# Patient Record
Sex: Female | Born: 1955 | Race: White | Hispanic: No | Marital: Married
Health system: Southern US, Community
[De-identification: ages and names within clinical notes are randomized; demographics above are authoritative.]

---

## 2017-01-05 ENCOUNTER — Ambulatory Visit: Payer: Self-pay | Admitting: Sports Medicine

## 2017-01-05 ENCOUNTER — Ambulatory Visit (INDEPENDENT_AMBULATORY_CARE_PROVIDER_SITE_OTHER): Payer: BLUE CROSS/BLUE SHIELD | Admitting: Sports Medicine

## 2017-01-05 ENCOUNTER — Ambulatory Visit (INDEPENDENT_AMBULATORY_CARE_PROVIDER_SITE_OTHER): Payer: BLUE CROSS/BLUE SHIELD

## 2017-01-05 ENCOUNTER — Encounter: Payer: Self-pay | Admitting: Sports Medicine

## 2017-01-05 DIAGNOSIS — Q667 Congenital pes cavus, unspecified foot: Secondary | ICD-10-CM

## 2017-01-05 DIAGNOSIS — M722 Plantar fascial fibromatosis: Secondary | ICD-10-CM | POA: Diagnosis not present

## 2017-01-05 DIAGNOSIS — R52 Pain, unspecified: Secondary | ICD-10-CM

## 2017-01-05 DIAGNOSIS — B351 Tinea unguium: Secondary | ICD-10-CM

## 2017-01-05 NOTE — Progress Notes (Signed)
   Subjective:    Patient ID: Tracey Smith, female    DOB: 1956-04-20, 61 y.o.   MRN: [id number]  HPI   I have some fungus on my right big toe and it has some cracking to it and took polish off and there was a weird color and the right heel is hurting and it is better but I do have inserts and wore heels for years at work    Review of Systems  All other systems reviewed and are negative.      Objective:   Physical Exam        Assessment & Plan:

## 2017-01-05 NOTE — Progress Notes (Signed)
Subjective: Tracey Smith is a 61 y.o. female patient seen today in office with complaint of painful thickened and discolored right first toenail Patient is desiring treatment for nail changes; has tried OTC topicals/Medication/Penlac in the past with no improvement. Reports that nail is becoming difficult to manage because of the thickness. Patient also admits to occasional right heel and arch pain. States that it is better after using orthotics, which she had made few years ago when she lived in [place]. States that sometimes she gets pain after work with leg spasms. However states since wearing orthotics. Pain has been much better spasms have been less frequent. Patient has no other pedal complaints at this time.   There are no active problems to display for this patient.   No current outpatient prescriptions on file prior to visit.   No current facility-administered medications on file prior to visit.     Allergies  Allergen Reactions  . Hydrocodone   . Eggs Or Egg-Derived Products Nausea Only    Objective: Physical Exam  General: Well developed, nourished, no acute distress, awake, alert and oriented x 3  Vascular: Dorsalis pedis artery 1/4 bilateral, Posterior tibial artery 1/4 bilateral, skin temperature warm to warm proximal to distal bilateral lower extremities, no varicosities, pedal hair present bilateral.  Neurological: Gross sensation present via light touch bilateral.   Dermatological: Skin is warm, dry, and supple bilateral, Right first toenail is short thick, and discolored with mild subungal debris, all other nails within normal limits, no webspace macerations present bilateral, no open lesions present bilateral, no callus/corns/hyperkeratotic tissue present bilateral. No signs of infection bilateral.  Musculoskeletal: No boney deformities noted bilateral. Subjective occasional pain along the right heel extending out arch likely from plantar fasciitis with equinus noted  bilateral. Muscular strength within normal limits without painon range of motion. No pain with calf compression bilateral.  X-rays right foot reveal pes cavus deformity. No other acute findings.  Assessment and Plan:  Problem List Items Addressed This Visit    None    Visit Diagnoses    Dermatophytosis of nail    -  Primary   R 1st toe   Relevant Medications   valACYclovir (VALTREX) 500 MG tablet   Other Relevant Orders   Fungus Culture with Smear   Pain       Relevant Orders   DG Foot Complete Right   Pes cavus       Plantar fasciitis          -Examined patient -Advised patient daily stretching and icing to avoid flare up for reoccurrence of right heel and arch pain. Also advised patient that she would benefit from a new custom set of orthotics. Patient will like office to check insurance coverage and will decide at next visit if she wants to be casted for scan for orthotics -Discussed treatment options for painful dystrophic nails  - Fungal culture of right first toenail was obtained and sent to [facility] lab -Patient to return in 4 weeks for follow up evaluation and discussion of fungal culture results or sooner if symptoms worsen.  Landis Martins, DPM

## 2017-01-05 NOTE — Patient Instructions (Signed)
For tennis shoes recommend:  Brooks Beast Ascis New balance Saucony Can be purchased at [employer] or [employer]  Vionic  SAS Can be purchased at [employer] or [employer]   For work shoes recommend: Sketchers Work Timberland boots  Can be purchased at a variety of places or [employer]   For casual shoes recommend: Vionic  Can be purchased at [employer] or [employer]  

## 2017-02-02 ENCOUNTER — Ambulatory Visit (INDEPENDENT_AMBULATORY_CARE_PROVIDER_SITE_OTHER): Payer: BLUE CROSS/BLUE SHIELD | Admitting: Sports Medicine

## 2017-02-02 DIAGNOSIS — R52 Pain, unspecified: Secondary | ICD-10-CM

## 2017-02-02 DIAGNOSIS — Q667 Congenital pes cavus, unspecified foot: Secondary | ICD-10-CM

## 2017-02-02 DIAGNOSIS — B351 Tinea unguium: Secondary | ICD-10-CM | POA: Diagnosis not present

## 2017-02-02 DIAGNOSIS — M722 Plantar fascial fibromatosis: Secondary | ICD-10-CM

## 2017-02-02 NOTE — Progress Notes (Signed)
Subjective: Tracey Smith is a 61 y.o. female patient seen today in office for fungal culture results. Patient has no other pedal complaints at this time.   There are no active problems to display for this patient.   Current Outpatient Prescriptions on File Prior to Visit  Medication Sig Dispense Refill  . CALCIUM PO Take by mouth.    . cetirizine (ZYRTEC) 10 MG tablet Take by mouth.    . estradiol (ESTRACE) 0.1 MG/GM vaginal cream Place 1 gram vaginally twice per week.    . valACYclovir (VALTREX) 500 MG tablet Take by mouth.    Marland Kitchen VITAMIN D, CHOLECALCIFEROL, PO Take by mouth.     No current facility-administered medications on file prior to visit.     Allergies  Allergen Reactions  . Hydrocodone   . Eggs Or Egg-Derived Products Nausea Only    Objective: Physical Exam  General: Well developed, nourished, no acute distress, awake, alert and oriented x 3  Vascular: Dorsalis pedis artery 2/4 bilateral, Posterior tibial artery 2/4 bilateral, skin temperature warm to warm proximal to distal bilateral lower extremities, no varicosities, pedal hair present bilateral.  Neurological: Gross sensation present via light touch bilateral.   Dermatological: Skin is warm, dry, and supple bilateral, Nails 1-10 are tender, short thick, and discolored with mild subungal debris with the right 1st toenail most involved, no webspace macerations present bilateral, no open lesions present bilateral, no callus/corns/hyperkeratotic tissue present bilateral. No signs of infection bilateral.  Musculoskeletal: No boney deformities noted bilateral. No reproducible plantar fascia pain. Muscular strength within normal limits without painon range of motion. No pain with calf compression bilateral.  Fungal culture + T Rubrum  Assessment and Plan:  Problem List Items Addressed This Visit    None    Visit Diagnoses    Dermatophytosis of nail    -  Primary   Relevant Orders   Hepatic Function Panel   Pain        Pes cavus       Plantar fasciitis          -Examined patient -Discussed treatment options for painful mycotic nails -Patient opt for oral Lamisil with full understanding of medication risks; ordered LFTs for review if within normal limits will proceed with sending Rx to pharmacy for lamisil 210m PO daily. Anticipate 12 week course.  -Patient opt for topical as well, Rx request for [employer] topical antifungal sent  -Advised good hygiene habits -Patient will like to wait for custom orthotics once her out-of-pocket and deductible is met. -Patient to return in 6 weeks for follow up evaluation or sooner if symptoms worsen.  TLandis Martins DPM

## 2017-02-03 ENCOUNTER — Telehealth: Payer: Self-pay | Admitting: *Deleted

## 2017-02-03 MED ORDER — NONFORMULARY OR COMPOUNDED ITEM: 2 refills | Status: AC

## 2017-02-03 NOTE — Telephone Encounter (Addendum)
-----   Message from Landis Martins, [place] sent at 02/02/2017  5:47 PM EDT ----- Regarding: [employer] Topical antifungal.02/03/2017-Faxed orders to [facility].

## 2017-03-17 ENCOUNTER — Ambulatory Visit: Payer: BLUE CROSS/BLUE SHIELD | Admitting: Sports Medicine

## 2018-08-16 DIAGNOSIS — Z01419 Encounter for gynecological examination (general) (routine) without abnormal findings: Secondary | ICD-10-CM | POA: Diagnosis not present

## 2018-08-16 DIAGNOSIS — Z6824 Body mass index (BMI) 24.0-24.9, adult: Secondary | ICD-10-CM | POA: Diagnosis not present

## 2018-08-16 DIAGNOSIS — Z1231 Encounter for screening mammogram for malignant neoplasm of breast: Secondary | ICD-10-CM | POA: Diagnosis not present

## 2019-07-31 DIAGNOSIS — Z23 Encounter for immunization: Secondary | ICD-10-CM | POA: Diagnosis not present

## 2019-07-31 DIAGNOSIS — Z13220 Encounter for screening for lipoid disorders: Secondary | ICD-10-CM | POA: Diagnosis not present

## 2019-07-31 DIAGNOSIS — E559 Vitamin D deficiency, unspecified: Secondary | ICD-10-CM | POA: Diagnosis not present

## 2019-07-31 DIAGNOSIS — Z0000 Encounter for general adult medical examination without abnormal findings: Secondary | ICD-10-CM | POA: Diagnosis not present

## 2019-07-31 DIAGNOSIS — Z780 Asymptomatic menopausal state: Secondary | ICD-10-CM | POA: Diagnosis not present

## 2019-09-12 DIAGNOSIS — Z1231 Encounter for screening mammogram for malignant neoplasm of breast: Secondary | ICD-10-CM | POA: Diagnosis not present

## 2019-09-12 DIAGNOSIS — Z01419 Encounter for gynecological examination (general) (routine) without abnormal findings: Secondary | ICD-10-CM | POA: Diagnosis not present

## 2019-09-12 DIAGNOSIS — Z124 Encounter for screening for malignant neoplasm of cervix: Secondary | ICD-10-CM | POA: Diagnosis not present

## 2019-11-07 DIAGNOSIS — D225 Melanocytic nevi of trunk: Secondary | ICD-10-CM | POA: Diagnosis not present

## 2019-11-07 DIAGNOSIS — L918 Other hypertrophic disorders of the skin: Secondary | ICD-10-CM | POA: Diagnosis not present

## 2019-11-07 DIAGNOSIS — D1801 Hemangioma of skin and subcutaneous tissue: Secondary | ICD-10-CM | POA: Diagnosis not present

## 2019-11-07 DIAGNOSIS — L570 Actinic keratosis: Secondary | ICD-10-CM | POA: Diagnosis not present

## 2019-11-07 DIAGNOSIS — L820 Inflamed seborrheic keratosis: Secondary | ICD-10-CM | POA: Diagnosis not present

## 2019-11-13 DIAGNOSIS — M25551 Pain in right hip: Secondary | ICD-10-CM | POA: Diagnosis not present

## 2019-11-13 DIAGNOSIS — M545 Low back pain: Secondary | ICD-10-CM | POA: Diagnosis not present

## 2019-11-13 DIAGNOSIS — R269 Unspecified abnormalities of gait and mobility: Secondary | ICD-10-CM | POA: Diagnosis not present

## 2019-11-13 DIAGNOSIS — M25571 Pain in right ankle and joints of right foot: Secondary | ICD-10-CM | POA: Diagnosis not present

## 2019-11-26 DIAGNOSIS — M25571 Pain in right ankle and joints of right foot: Secondary | ICD-10-CM | POA: Diagnosis not present

## 2019-11-26 DIAGNOSIS — M545 Low back pain: Secondary | ICD-10-CM | POA: Diagnosis not present

## 2019-11-26 DIAGNOSIS — M25551 Pain in right hip: Secondary | ICD-10-CM | POA: Diagnosis not present

## 2019-11-26 DIAGNOSIS — R269 Unspecified abnormalities of gait and mobility: Secondary | ICD-10-CM | POA: Diagnosis not present

## 2019-12-11 DIAGNOSIS — M545 Low back pain: Secondary | ICD-10-CM | POA: Diagnosis not present

## 2019-12-11 DIAGNOSIS — R269 Unspecified abnormalities of gait and mobility: Secondary | ICD-10-CM | POA: Diagnosis not present

## 2019-12-11 DIAGNOSIS — M25571 Pain in right ankle and joints of right foot: Secondary | ICD-10-CM | POA: Diagnosis not present

## 2019-12-11 DIAGNOSIS — M25551 Pain in right hip: Secondary | ICD-10-CM | POA: Diagnosis not present

## 2019-12-26 DIAGNOSIS — M545 Low back pain: Secondary | ICD-10-CM | POA: Diagnosis not present

## 2019-12-26 DIAGNOSIS — M25551 Pain in right hip: Secondary | ICD-10-CM | POA: Diagnosis not present

## 2019-12-26 DIAGNOSIS — R269 Unspecified abnormalities of gait and mobility: Secondary | ICD-10-CM | POA: Diagnosis not present

## 2019-12-26 DIAGNOSIS — M25571 Pain in right ankle and joints of right foot: Secondary | ICD-10-CM | POA: Diagnosis not present

## 2020-01-11 DIAGNOSIS — Z23 Encounter for immunization: Secondary | ICD-10-CM | POA: Diagnosis not present

## 2020-03-06 DIAGNOSIS — M79672 Pain in left foot: Secondary | ICD-10-CM | POA: Diagnosis not present

## 2020-03-06 DIAGNOSIS — M25572 Pain in left ankle and joints of left foot: Secondary | ICD-10-CM | POA: Diagnosis not present

## 2020-08-07 DIAGNOSIS — Z1331 Encounter for screening for depression: Secondary | ICD-10-CM | POA: Diagnosis not present

## 2020-08-07 DIAGNOSIS — L570 Actinic keratosis: Secondary | ICD-10-CM | POA: Diagnosis not present

## 2020-08-07 DIAGNOSIS — Z0000 Encounter for general adult medical examination without abnormal findings: Secondary | ICD-10-CM | POA: Diagnosis not present

## 2020-08-07 DIAGNOSIS — E7800 Pure hypercholesterolemia, unspecified: Secondary | ICD-10-CM | POA: Diagnosis not present

## 2020-08-07 DIAGNOSIS — Z6824 Body mass index (BMI) 24.0-24.9, adult: Secondary | ICD-10-CM | POA: Diagnosis not present

## 2020-08-07 DIAGNOSIS — R5383 Other fatigue: Secondary | ICD-10-CM | POA: Diagnosis not present

## 2020-08-07 DIAGNOSIS — E559 Vitamin D deficiency, unspecified: Secondary | ICD-10-CM | POA: Diagnosis not present

## 2020-08-07 DIAGNOSIS — M8580 Other specified disorders of bone density and structure, unspecified site: Secondary | ICD-10-CM | POA: Diagnosis not present

## 2020-10-01 DIAGNOSIS — N952 Postmenopausal atrophic vaginitis: Secondary | ICD-10-CM | POA: Diagnosis not present

## 2020-10-01 DIAGNOSIS — Z1231 Encounter for screening mammogram for malignant neoplasm of breast: Secondary | ICD-10-CM | POA: Diagnosis not present

## 2020-10-01 DIAGNOSIS — Z7689 Persons encountering health services in other specified circumstances: Secondary | ICD-10-CM | POA: Diagnosis not present

## 2020-10-24 DIAGNOSIS — Z20822 Contact with and (suspected) exposure to covid-19: Secondary | ICD-10-CM | POA: Diagnosis not present

## 2020-12-02 DIAGNOSIS — L820 Inflamed seborrheic keratosis: Secondary | ICD-10-CM | POA: Diagnosis not present

## 2020-12-02 DIAGNOSIS — D1801 Hemangioma of skin and subcutaneous tissue: Secondary | ICD-10-CM | POA: Diagnosis not present

## 2020-12-02 DIAGNOSIS — L812 Freckles: Secondary | ICD-10-CM | POA: Diagnosis not present

## 2020-12-02 DIAGNOSIS — L821 Other seborrheic keratosis: Secondary | ICD-10-CM | POA: Diagnosis not present

## 2020-12-02 DIAGNOSIS — L438 Other lichen planus: Secondary | ICD-10-CM | POA: Diagnosis not present

## 2020-12-10 DIAGNOSIS — Z23 Encounter for immunization: Secondary | ICD-10-CM | POA: Diagnosis not present

## 2021-01-02 DIAGNOSIS — Z20822 Contact with and (suspected) exposure to covid-19: Secondary | ICD-10-CM | POA: Diagnosis not present

## 2021-04-08 DIAGNOSIS — L821 Other seborrheic keratosis: Secondary | ICD-10-CM | POA: Diagnosis not present

## 2021-04-08 DIAGNOSIS — D485 Neoplasm of uncertain behavior of skin: Secondary | ICD-10-CM | POA: Diagnosis not present

## 2021-04-08 DIAGNOSIS — B078 Other viral warts: Secondary | ICD-10-CM | POA: Diagnosis not present

## 2021-05-29 DIAGNOSIS — Z23 Encounter for immunization: Secondary | ICD-10-CM | POA: Diagnosis not present

## 2021-06-10 DIAGNOSIS — Z23 Encounter for immunization: Secondary | ICD-10-CM | POA: Diagnosis not present

## 2021-06-16 DIAGNOSIS — C6932 Malignant neoplasm of left choroid: Secondary | ICD-10-CM | POA: Diagnosis not present

## 2021-06-16 DIAGNOSIS — Z135 Encounter for screening for eye and ear disorders: Secondary | ICD-10-CM | POA: Diagnosis not present

## 2021-06-16 DIAGNOSIS — H02831 Dermatochalasis of right upper eyelid: Secondary | ICD-10-CM | POA: Diagnosis not present

## 2021-06-16 DIAGNOSIS — H2513 Age-related nuclear cataract, bilateral: Secondary | ICD-10-CM | POA: Diagnosis not present

## 2021-06-16 DIAGNOSIS — H02834 Dermatochalasis of left upper eyelid: Secondary | ICD-10-CM | POA: Diagnosis not present

## 2021-07-28 DIAGNOSIS — Z961 Presence of intraocular lens: Secondary | ICD-10-CM | POA: Diagnosis not present

## 2021-07-28 DIAGNOSIS — H35362 Drusen (degenerative) of macula, left eye: Secondary | ICD-10-CM | POA: Diagnosis not present

## 2021-07-28 DIAGNOSIS — H318 Other specified disorders of choroid: Secondary | ICD-10-CM | POA: Diagnosis not present

## 2021-08-10 ENCOUNTER — Other Ambulatory Visit: Payer: Self-pay | Admitting: Obstetrics

## 2021-08-10 DIAGNOSIS — Z1231 Encounter for screening mammogram for malignant neoplasm of breast: Secondary | ICD-10-CM

## 2021-08-17 DIAGNOSIS — Z1331 Encounter for screening for depression: Secondary | ICD-10-CM | POA: Diagnosis not present

## 2021-08-17 DIAGNOSIS — Z0000 Encounter for general adult medical examination without abnormal findings: Secondary | ICD-10-CM | POA: Diagnosis not present

## 2021-08-17 DIAGNOSIS — Z23 Encounter for immunization: Secondary | ICD-10-CM | POA: Diagnosis not present

## 2021-08-17 DIAGNOSIS — M1990 Unspecified osteoarthritis, unspecified site: Secondary | ICD-10-CM | POA: Diagnosis not present

## 2021-08-17 DIAGNOSIS — E7800 Pure hypercholesterolemia, unspecified: Secondary | ICD-10-CM | POA: Diagnosis not present

## 2021-08-17 DIAGNOSIS — M8589 Other specified disorders of bone density and structure, multiple sites: Secondary | ICD-10-CM | POA: Diagnosis not present

## 2021-08-17 DIAGNOSIS — Z6824 Body mass index (BMI) 24.0-24.9, adult: Secondary | ICD-10-CM | POA: Diagnosis not present

## 2021-08-17 DIAGNOSIS — E559 Vitamin D deficiency, unspecified: Secondary | ICD-10-CM | POA: Diagnosis not present

## 2021-09-29 DIAGNOSIS — Z01419 Encounter for gynecological examination (general) (routine) without abnormal findings: Secondary | ICD-10-CM | POA: Diagnosis not present

## 2021-10-05 ENCOUNTER — Ambulatory Visit
Admission: RE | Admit: 2021-10-05 | Discharge: 2021-10-05 | Disposition: A | Discharge status: Home / Self Care | Payer: Medicare Other | Source: Ambulatory Visit | Attending: Obstetrics | Admitting: Obstetrics

## 2021-10-05 ENCOUNTER — Ambulatory Visit: Payer: BLUE CROSS/BLUE SHIELD

## 2021-10-05 ENCOUNTER — Other Ambulatory Visit: Payer: Self-pay

## 2021-10-05 DIAGNOSIS — Z1231 Encounter for screening mammogram for malignant neoplasm of breast: Secondary | ICD-10-CM | POA: Diagnosis not present

## 2021-10-28 DIAGNOSIS — H2513 Age-related nuclear cataract, bilateral: Secondary | ICD-10-CM | POA: Diagnosis not present

## 2021-10-28 DIAGNOSIS — H35362 Drusen (degenerative) of macula, left eye: Secondary | ICD-10-CM | POA: Diagnosis not present

## 2021-10-28 DIAGNOSIS — C6932 Malignant neoplasm of left choroid: Secondary | ICD-10-CM | POA: Diagnosis not present

## 2021-12-15 DIAGNOSIS — D1801 Hemangioma of skin and subcutaneous tissue: Secondary | ICD-10-CM | POA: Diagnosis not present

## 2021-12-15 DIAGNOSIS — L738 Other specified follicular disorders: Secondary | ICD-10-CM | POA: Diagnosis not present

## 2021-12-15 DIAGNOSIS — B078 Other viral warts: Secondary | ICD-10-CM | POA: Diagnosis not present

## 2021-12-15 DIAGNOSIS — L812 Freckles: Secondary | ICD-10-CM | POA: Diagnosis not present

## 2021-12-15 DIAGNOSIS — L821 Other seborrheic keratosis: Secondary | ICD-10-CM | POA: Diagnosis not present

## 2022-02-09 DIAGNOSIS — M25561 Pain in right knee: Secondary | ICD-10-CM | POA: Diagnosis not present

## 2022-02-09 DIAGNOSIS — M25562 Pain in left knee: Secondary | ICD-10-CM | POA: Diagnosis not present

## 2022-02-09 DIAGNOSIS — G8929 Other chronic pain: Secondary | ICD-10-CM | POA: Diagnosis not present

## 2022-03-02 DIAGNOSIS — D487 Neoplasm of uncertain behavior of other specified sites: Secondary | ICD-10-CM | POA: Diagnosis not present

## 2022-03-02 DIAGNOSIS — H3589 Other specified retinal disorders: Secondary | ICD-10-CM | POA: Diagnosis not present

## 2022-06-15 DIAGNOSIS — M546 Pain in thoracic spine: Secondary | ICD-10-CM | POA: Diagnosis not present

## 2022-06-22 DIAGNOSIS — M546 Pain in thoracic spine: Secondary | ICD-10-CM | POA: Diagnosis not present

## 2022-06-22 DIAGNOSIS — Z23 Encounter for immunization: Secondary | ICD-10-CM | POA: Diagnosis not present

## 2022-07-07 DIAGNOSIS — M546 Pain in thoracic spine: Secondary | ICD-10-CM | POA: Diagnosis not present

## 2022-07-13 DIAGNOSIS — M546 Pain in thoracic spine: Secondary | ICD-10-CM | POA: Diagnosis not present

## 2022-07-17 DIAGNOSIS — M546 Pain in thoracic spine: Secondary | ICD-10-CM | POA: Diagnosis not present

## 2022-07-17 DIAGNOSIS — M545 Low back pain, unspecified: Secondary | ICD-10-CM | POA: Diagnosis not present

## 2022-07-22 DIAGNOSIS — M546 Pain in thoracic spine: Secondary | ICD-10-CM | POA: Diagnosis not present

## 2022-07-22 DIAGNOSIS — M545 Low back pain, unspecified: Secondary | ICD-10-CM | POA: Diagnosis not present

## 2022-08-03 DIAGNOSIS — M546 Pain in thoracic spine: Secondary | ICD-10-CM | POA: Diagnosis not present

## 2022-08-03 DIAGNOSIS — M545 Low back pain, unspecified: Secondary | ICD-10-CM | POA: Diagnosis not present

## 2022-08-11 DIAGNOSIS — M546 Pain in thoracic spine: Secondary | ICD-10-CM | POA: Diagnosis not present

## 2022-08-11 DIAGNOSIS — M545 Low back pain, unspecified: Secondary | ICD-10-CM | POA: Diagnosis not present

## 2022-08-18 DIAGNOSIS — M546 Pain in thoracic spine: Secondary | ICD-10-CM | POA: Diagnosis not present

## 2022-08-18 DIAGNOSIS — M545 Low back pain, unspecified: Secondary | ICD-10-CM | POA: Diagnosis not present

## 2022-08-20 DIAGNOSIS — M8589 Other specified disorders of bone density and structure, multiple sites: Secondary | ICD-10-CM | POA: Diagnosis not present

## 2022-08-20 DIAGNOSIS — I951 Orthostatic hypotension: Secondary | ICD-10-CM | POA: Diagnosis not present

## 2022-08-20 DIAGNOSIS — E7800 Pure hypercholesterolemia, unspecified: Secondary | ICD-10-CM | POA: Diagnosis not present

## 2022-08-20 DIAGNOSIS — Z1331 Encounter for screening for depression: Secondary | ICD-10-CM | POA: Diagnosis not present

## 2022-08-20 DIAGNOSIS — Z6824 Body mass index (BMI) 24.0-24.9, adult: Secondary | ICD-10-CM | POA: Diagnosis not present

## 2022-08-20 DIAGNOSIS — Z0000 Encounter for general adult medical examination without abnormal findings: Secondary | ICD-10-CM | POA: Diagnosis not present

## 2022-08-24 DIAGNOSIS — M546 Pain in thoracic spine: Secondary | ICD-10-CM | POA: Diagnosis not present

## 2022-08-24 DIAGNOSIS — M545 Low back pain, unspecified: Secondary | ICD-10-CM | POA: Diagnosis not present

## 2022-09-09 DIAGNOSIS — M546 Pain in thoracic spine: Secondary | ICD-10-CM | POA: Diagnosis not present

## 2022-09-09 DIAGNOSIS — M545 Low back pain, unspecified: Secondary | ICD-10-CM | POA: Diagnosis not present

## 2022-09-14 DIAGNOSIS — D3132 Benign neoplasm of left choroid: Secondary | ICD-10-CM | POA: Diagnosis not present

## 2022-09-14 DIAGNOSIS — H3589 Other specified retinal disorders: Secondary | ICD-10-CM | POA: Diagnosis not present

## 2022-09-21 DIAGNOSIS — M546 Pain in thoracic spine: Secondary | ICD-10-CM | POA: Diagnosis not present

## 2022-09-21 DIAGNOSIS — M545 Low back pain, unspecified: Secondary | ICD-10-CM | POA: Diagnosis not present

## 2022-10-04 DIAGNOSIS — M545 Low back pain, unspecified: Secondary | ICD-10-CM | POA: Diagnosis not present

## 2022-10-04 DIAGNOSIS — M546 Pain in thoracic spine: Secondary | ICD-10-CM | POA: Diagnosis not present

## 2022-10-18 DIAGNOSIS — M546 Pain in thoracic spine: Secondary | ICD-10-CM | POA: Diagnosis not present

## 2022-10-18 DIAGNOSIS — M545 Low back pain, unspecified: Secondary | ICD-10-CM | POA: Diagnosis not present

## 2022-10-29 DIAGNOSIS — M546 Pain in thoracic spine: Secondary | ICD-10-CM | POA: Diagnosis not present

## 2022-10-29 DIAGNOSIS — M545 Low back pain, unspecified: Secondary | ICD-10-CM | POA: Diagnosis not present

## 2022-11-01 DIAGNOSIS — Z1231 Encounter for screening mammogram for malignant neoplasm of breast: Secondary | ICD-10-CM | POA: Diagnosis not present

## 2022-11-01 DIAGNOSIS — A609 Anogenital herpesviral infection, unspecified: Secondary | ICD-10-CM | POA: Diagnosis not present

## 2022-11-01 DIAGNOSIS — N952 Postmenopausal atrophic vaginitis: Secondary | ICD-10-CM | POA: Diagnosis not present

## 2022-11-10 DIAGNOSIS — M546 Pain in thoracic spine: Secondary | ICD-10-CM | POA: Diagnosis not present

## 2022-11-10 DIAGNOSIS — M545 Low back pain, unspecified: Secondary | ICD-10-CM | POA: Diagnosis not present

## 2022-11-24 DIAGNOSIS — M545 Low back pain, unspecified: Secondary | ICD-10-CM | POA: Diagnosis not present

## 2022-11-24 DIAGNOSIS — M546 Pain in thoracic spine: Secondary | ICD-10-CM | POA: Diagnosis not present

## 2022-12-08 DIAGNOSIS — M546 Pain in thoracic spine: Secondary | ICD-10-CM | POA: Diagnosis not present

## 2022-12-08 DIAGNOSIS — M545 Low back pain, unspecified: Secondary | ICD-10-CM | POA: Diagnosis not present

## 2022-12-27 DIAGNOSIS — L578 Other skin changes due to chronic exposure to nonionizing radiation: Secondary | ICD-10-CM | POA: Diagnosis not present

## 2022-12-27 DIAGNOSIS — L438 Other lichen planus: Secondary | ICD-10-CM | POA: Diagnosis not present

## 2022-12-27 DIAGNOSIS — L720 Epidermal cyst: Secondary | ICD-10-CM | POA: Diagnosis not present

## 2022-12-27 DIAGNOSIS — L821 Other seborrheic keratosis: Secondary | ICD-10-CM | POA: Diagnosis not present

## 2022-12-27 DIAGNOSIS — D2272 Melanocytic nevi of left lower limb, including hip: Secondary | ICD-10-CM | POA: Diagnosis not present

## 2022-12-27 DIAGNOSIS — L812 Freckles: Secondary | ICD-10-CM | POA: Diagnosis not present

## 2022-12-27 DIAGNOSIS — D1801 Hemangioma of skin and subcutaneous tissue: Secondary | ICD-10-CM | POA: Diagnosis not present

## 2023-03-15 DIAGNOSIS — H3589 Other specified retinal disorders: Secondary | ICD-10-CM | POA: Diagnosis not present

## 2023-03-15 DIAGNOSIS — D487 Neoplasm of uncertain behavior of other specified sites: Secondary | ICD-10-CM | POA: Diagnosis not present

## 2023-03-15 DIAGNOSIS — H318 Other specified disorders of choroid: Secondary | ICD-10-CM | POA: Diagnosis not present

## 2023-05-06 DIAGNOSIS — Z23 Encounter for immunization: Secondary | ICD-10-CM | POA: Diagnosis not present

## 2023-07-03 DIAGNOSIS — Z23 Encounter for immunization: Secondary | ICD-10-CM | POA: Diagnosis not present

## 2023-07-19 IMAGING — MG MM DIGITAL SCREENING BILAT W/ TOMO AND CAD
8 series · 8 of 24 positions shown · non-contrast
Comparison: Previous exam(s).

CLINICAL DATA: Screening.

EXAM:
DIGITAL SCREENING BILATERAL MAMMOGRAM WITH TOMOSYNTHESIS AND CAD
TECHNIQUE: Bilateral screening digital craniocaudal and mediolateral oblique
mammograms were obtained. Bilateral screening digital breast
tomosynthesis was performed. The images were evaluated with
computer-aided detection.

[L MLO synth-2D]
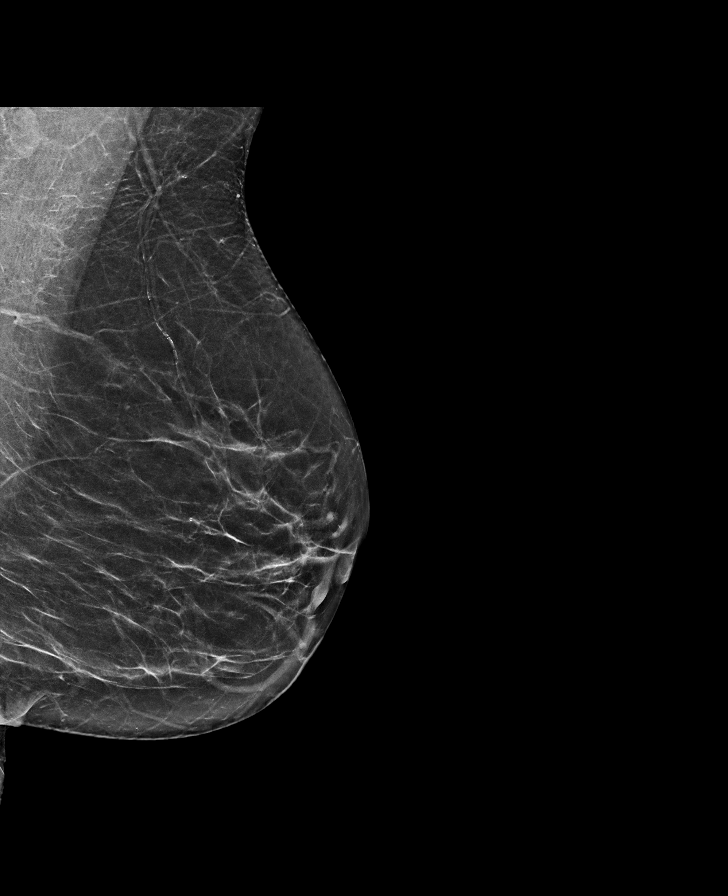

[R CC synth-2D]
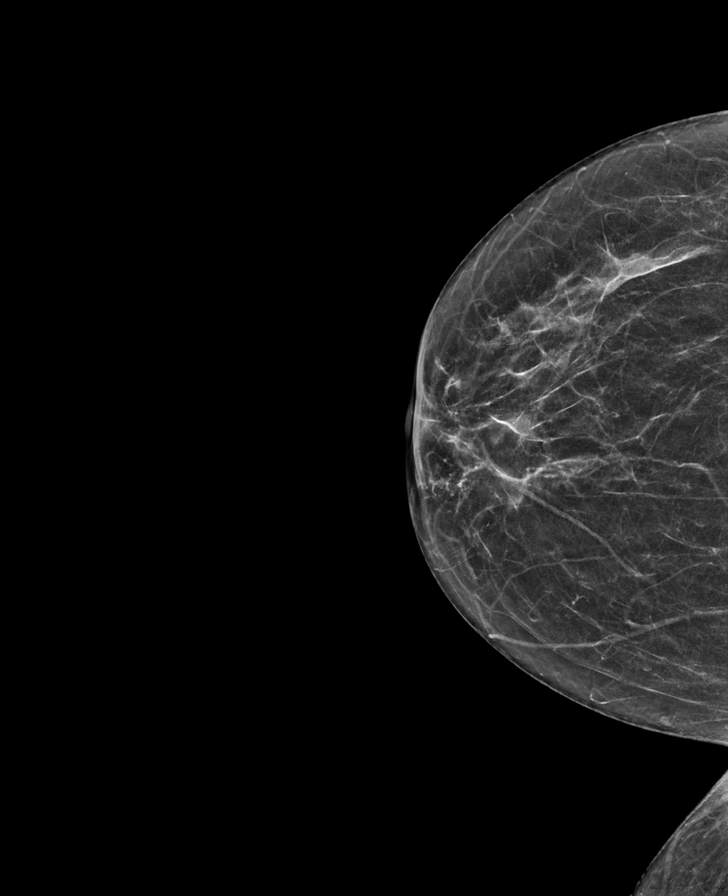

[R MLO synth-2D]
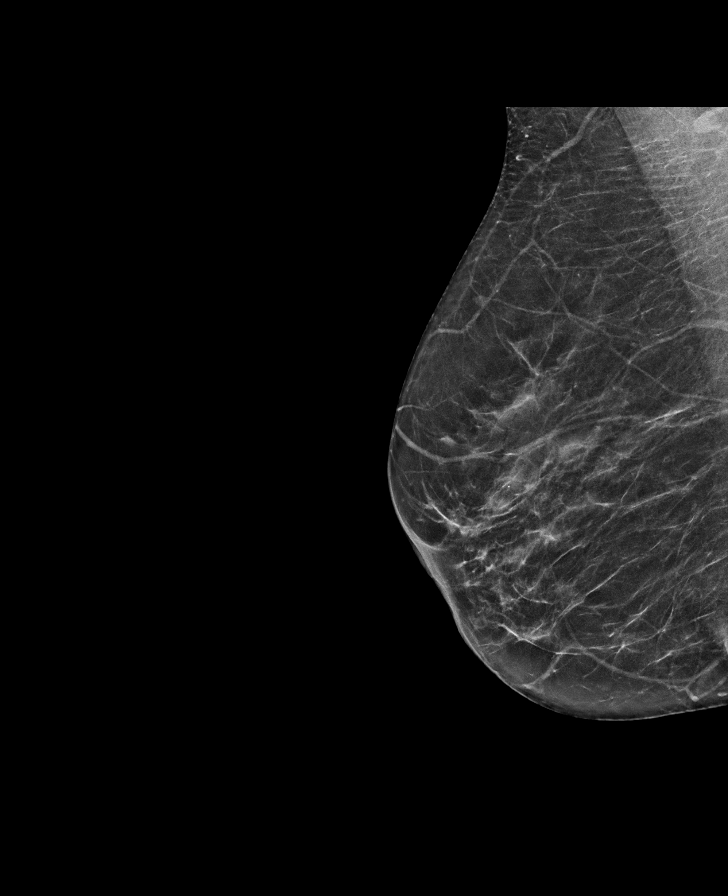

[L CC synth-2D]
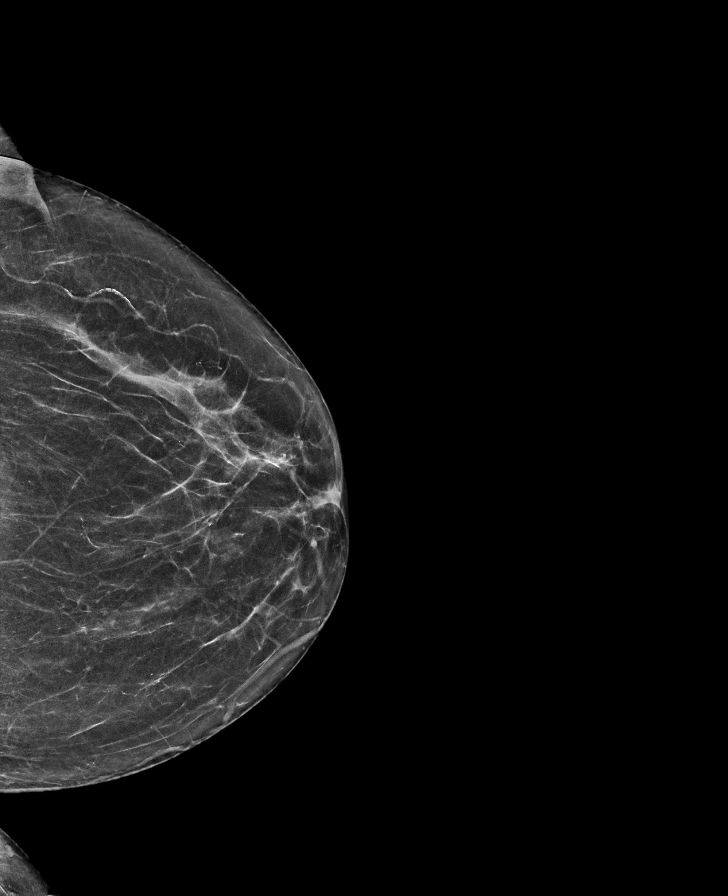

[R MLO tomo · tomo slice 29/57.0]
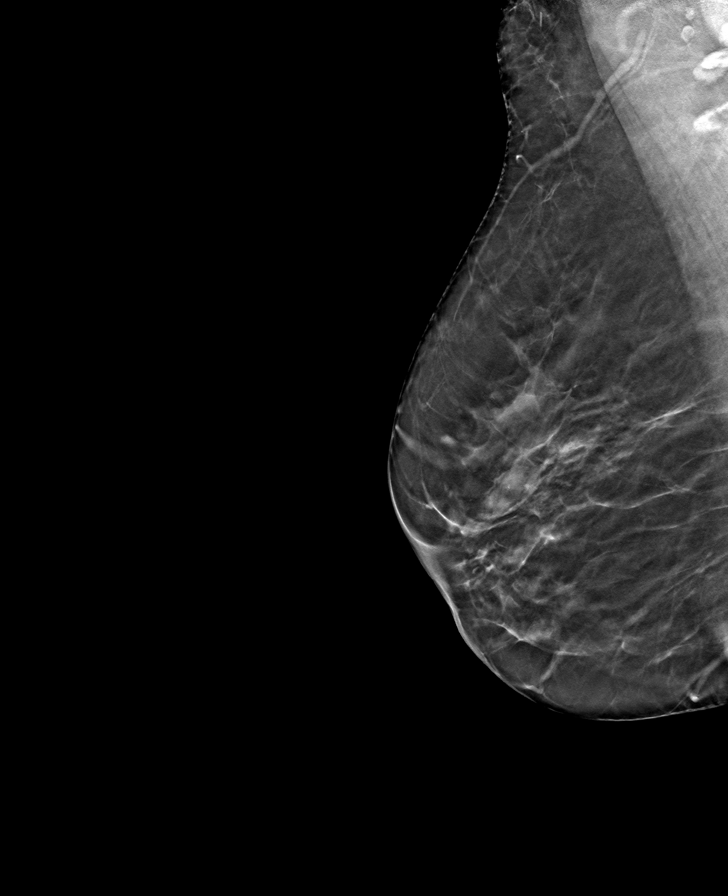

[L MLO tomo · tomo slice 34/67.0]
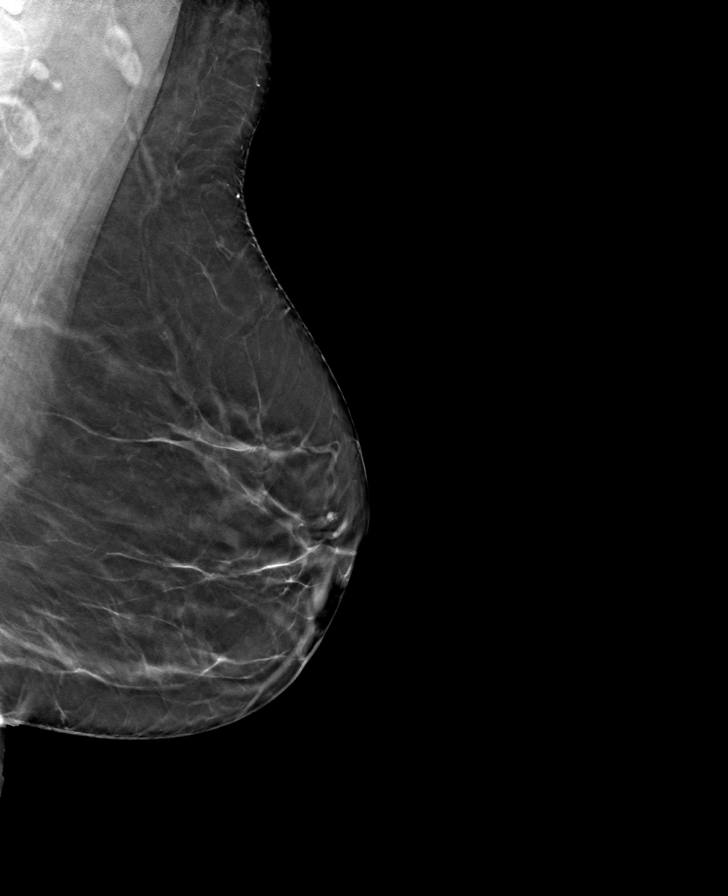

[L CC tomo · tomo slice 31/60.0]
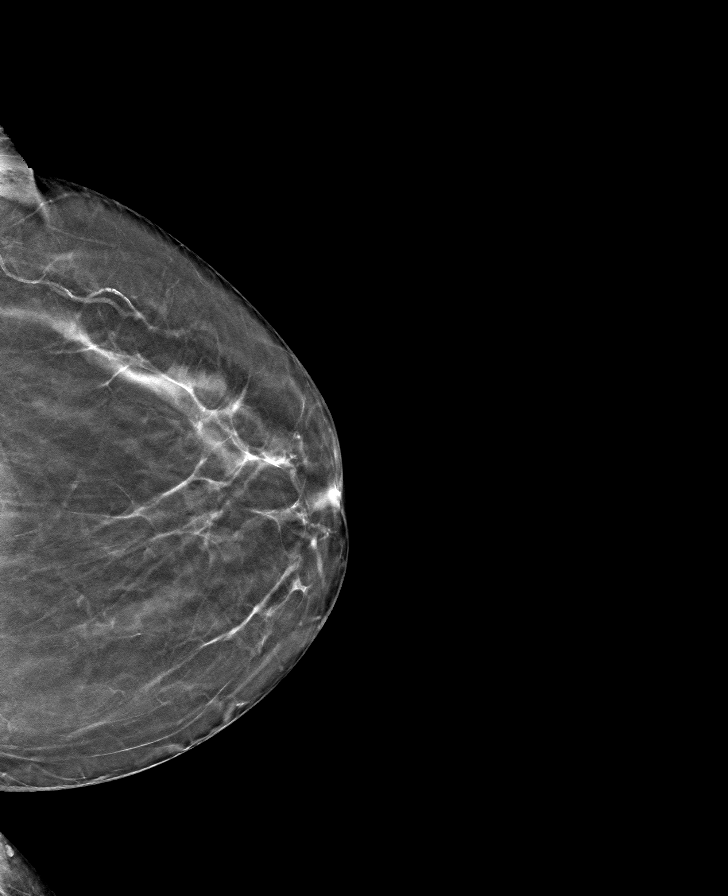

[R CC tomo · tomo slice 27/54.0]
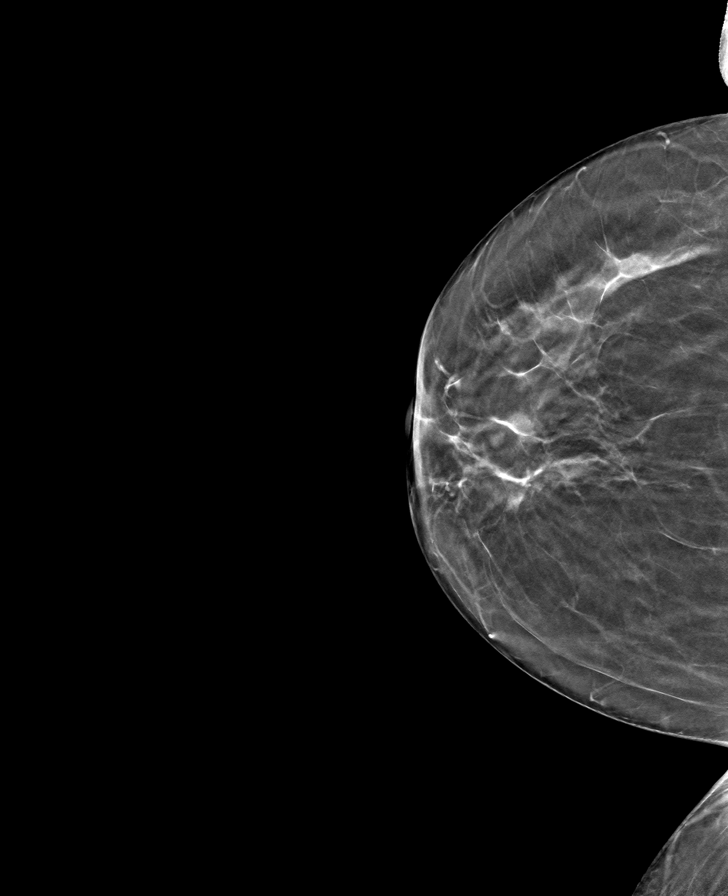

[8 of 24 positions shown; findings below may reference images not displayed]

ACR Breast Density Category b: There are scattered areas of
fibroglandular density.
FINDINGS: There are no findings suspicious for malignancy.
IMPRESSION: No mammographic evidence of malignancy. A result letter of this
screening mammogram will be mailed directly to the patient.

RECOMMENDATION:
Screening mammogram in one year. (Code:51-O-LD2)

BI-RADS CATEGORY  1: Negative.

## 2023-08-24 DIAGNOSIS — M8580 Other specified disorders of bone density and structure, unspecified site: Secondary | ICD-10-CM | POA: Diagnosis not present

## 2023-08-24 DIAGNOSIS — E559 Vitamin D deficiency, unspecified: Secondary | ICD-10-CM | POA: Diagnosis not present

## 2023-08-24 DIAGNOSIS — Z79899 Other long term (current) drug therapy: Secondary | ICD-10-CM | POA: Diagnosis not present

## 2023-08-24 DIAGNOSIS — Z0000 Encounter for general adult medical examination without abnormal findings: Secondary | ICD-10-CM | POA: Diagnosis not present

## 2023-08-24 DIAGNOSIS — E7800 Pure hypercholesterolemia, unspecified: Secondary | ICD-10-CM | POA: Diagnosis not present

## 2024-10-23 ENCOUNTER — Institutional Professional Consult (permissible substitution) (INDEPENDENT_AMBULATORY_CARE_PROVIDER_SITE_OTHER): Admitting: Otolaryngology
